# Patient Record
Sex: Female | Born: 1940 | Race: White | Hispanic: No | Marital: Married | Smoking: Never smoker
Health system: Southern US, Community
[De-identification: ages and names within clinical notes are randomized; demographics above are authoritative.]

## PROBLEM LIST (undated history)

## (undated) DIAGNOSIS — G44209 Tension-type headache, unspecified, not intractable: Secondary | ICD-10-CM

## (undated) DIAGNOSIS — Z8742 Personal history of other diseases of the female genital tract: Secondary | ICD-10-CM

## (undated) DIAGNOSIS — E785 Hyperlipidemia, unspecified: Secondary | ICD-10-CM

## (undated) DIAGNOSIS — R519 Headache, unspecified: Secondary | ICD-10-CM

## (undated) DIAGNOSIS — Z87898 Personal history of other specified conditions: Secondary | ICD-10-CM

## (undated) DIAGNOSIS — M778 Other enthesopathies, not elsewhere classified: Secondary | ICD-10-CM

## (undated) DIAGNOSIS — M810 Age-related osteoporosis without current pathological fracture: Secondary | ICD-10-CM

## (undated) DIAGNOSIS — Z8619 Personal history of other infectious and parasitic diseases: Secondary | ICD-10-CM

## (undated) DIAGNOSIS — Z8709 Personal history of other diseases of the respiratory system: Secondary | ICD-10-CM

## (undated) DIAGNOSIS — R55 Syncope and collapse: Secondary | ICD-10-CM

## (undated) DIAGNOSIS — G43909 Migraine, unspecified, not intractable, without status migrainosus: Secondary | ICD-10-CM

## (undated) DIAGNOSIS — I959 Hypotension, unspecified: Secondary | ICD-10-CM

## (undated) DIAGNOSIS — D219 Benign neoplasm of connective and other soft tissue, unspecified: Secondary | ICD-10-CM

## (undated) DIAGNOSIS — R51 Headache: Secondary | ICD-10-CM

## (undated) DIAGNOSIS — E039 Hypothyroidism, unspecified: Secondary | ICD-10-CM

## (undated) DIAGNOSIS — H532 Diplopia: Secondary | ICD-10-CM

## (undated) DIAGNOSIS — Z87440 Personal history of urinary (tract) infections: Secondary | ICD-10-CM

## (undated) DIAGNOSIS — B379 Candidiasis, unspecified: Secondary | ICD-10-CM

## (undated) DIAGNOSIS — R0609 Other forms of dyspnea: Secondary | ICD-10-CM

## (undated) HISTORY — DX: Candidiasis, unspecified: B37.9

## (undated) HISTORY — DX: Personal history of other specified conditions: Z87.898

## (undated) HISTORY — DX: Personal history of other diseases of the female genital tract: Z87.42

## (undated) HISTORY — DX: Benign neoplasm of connective and other soft tissue, unspecified: D21.9

## (undated) HISTORY — DX: Personal history of other infectious and parasitic diseases: Z86.19

## (undated) HISTORY — PX: CATARACT EXTRACTION W/ INTRAOCULAR LENS  IMPLANT, BILATERAL: SHX1307

## (undated) HISTORY — DX: Personal history of urinary (tract) infections: Z87.440

---

## 1980-06-12 HISTORY — PX: ENDOMETRIAL BIOPSY: SHX622

## 1984-06-12 HISTORY — PX: TONSILLECTOMY: SUR1361

## 1999-02-04 ENCOUNTER — Ambulatory Visit (HOSPITAL_COMMUNITY): Admission: RE | Admit: 1999-02-04 | Discharge: 1999-02-04 | Payer: Self-pay | Admitting: Family Medicine

## 1999-11-15 ENCOUNTER — Encounter: Payer: Self-pay | Admitting: Rheumatology

## 1999-11-15 ENCOUNTER — Ambulatory Visit (HOSPITAL_COMMUNITY): Admission: RE | Admit: 1999-11-15 | Discharge: 1999-11-15 | Payer: Self-pay | Admitting: *Deleted

## 1999-12-21 ENCOUNTER — Encounter: Admission: RE | Admit: 1999-12-21 | Discharge: 1999-12-21 | Payer: Self-pay | Admitting: Family Medicine

## 1999-12-21 ENCOUNTER — Encounter: Payer: Self-pay | Admitting: Family Medicine

## 1999-12-23 ENCOUNTER — Ambulatory Visit (HOSPITAL_COMMUNITY): Admission: RE | Admit: 1999-12-23 | Discharge: 1999-12-23 | Payer: Self-pay | Admitting: *Deleted

## 1999-12-23 ENCOUNTER — Encounter: Payer: Self-pay | Admitting: Rheumatology

## 2000-01-17 ENCOUNTER — Other Ambulatory Visit: Admission: RE | Admit: 2000-01-17 | Discharge: 2000-01-17 | Payer: Self-pay | Admitting: Family Medicine

## 2000-05-29 ENCOUNTER — Ambulatory Visit (HOSPITAL_COMMUNITY): Admission: RE | Admit: 2000-05-29 | Discharge: 2000-05-29 | Payer: Self-pay | Admitting: Gastroenterology

## 2000-05-29 ENCOUNTER — Encounter (INDEPENDENT_AMBULATORY_CARE_PROVIDER_SITE_OTHER): Payer: Self-pay | Admitting: *Deleted

## 2000-12-25 ENCOUNTER — Encounter: Payer: Self-pay | Admitting: Family Medicine

## 2000-12-25 ENCOUNTER — Encounter: Admission: RE | Admit: 2000-12-25 | Discharge: 2000-12-25 | Payer: Self-pay | Admitting: Family Medicine

## 2001-01-13 ENCOUNTER — Encounter: Payer: Self-pay | Admitting: Emergency Medicine

## 2001-01-13 ENCOUNTER — Emergency Department (HOSPITAL_COMMUNITY): Admission: EM | Admit: 2001-01-13 | Discharge: 2001-01-14 | Payer: Self-pay | Admitting: Emergency Medicine

## 2001-02-01 ENCOUNTER — Encounter: Payer: Self-pay | Admitting: Family Medicine

## 2001-02-01 ENCOUNTER — Encounter: Admission: RE | Admit: 2001-02-01 | Discharge: 2001-02-01 | Payer: Self-pay | Admitting: Family Medicine

## 2001-02-04 ENCOUNTER — Encounter: Payer: Self-pay | Admitting: *Deleted

## 2001-02-04 ENCOUNTER — Encounter: Admission: RE | Admit: 2001-02-04 | Discharge: 2001-02-04 | Payer: Self-pay | Admitting: *Deleted

## 2002-02-04 ENCOUNTER — Encounter: Payer: Self-pay | Admitting: Family Medicine

## 2002-02-04 ENCOUNTER — Encounter: Admission: RE | Admit: 2002-02-04 | Discharge: 2002-02-04 | Payer: Self-pay | Admitting: Family Medicine

## 2002-12-10 ENCOUNTER — Encounter: Payer: Self-pay | Admitting: Internal Medicine

## 2002-12-10 ENCOUNTER — Encounter: Admission: RE | Admit: 2002-12-10 | Discharge: 2002-12-10 | Payer: Self-pay | Admitting: Internal Medicine

## 2003-02-09 ENCOUNTER — Encounter: Admission: RE | Admit: 2003-02-09 | Discharge: 2003-02-09 | Payer: Self-pay | Admitting: Internal Medicine

## 2003-02-09 ENCOUNTER — Encounter: Payer: Self-pay | Admitting: Internal Medicine

## 2003-06-11 ENCOUNTER — Encounter: Admission: RE | Admit: 2003-06-11 | Discharge: 2003-06-11 | Payer: Self-pay | Admitting: Internal Medicine

## 2003-12-10 ENCOUNTER — Ambulatory Visit (HOSPITAL_COMMUNITY): Admission: RE | Admit: 2003-12-10 | Discharge: 2003-12-10 | Payer: Self-pay | Admitting: *Deleted

## 2004-02-11 ENCOUNTER — Encounter: Admission: RE | Admit: 2004-02-11 | Discharge: 2004-02-11 | Payer: Self-pay | Admitting: Internal Medicine

## 2004-06-20 ENCOUNTER — Encounter: Admission: RE | Admit: 2004-06-20 | Discharge: 2004-06-20 | Payer: Self-pay | Admitting: Internal Medicine

## 2005-02-28 ENCOUNTER — Encounter: Admission: RE | Admit: 2005-02-28 | Discharge: 2005-02-28 | Payer: Self-pay | Admitting: Internal Medicine

## 2005-06-21 ENCOUNTER — Other Ambulatory Visit: Admission: RE | Admit: 2005-06-21 | Discharge: 2005-06-21 | Payer: Self-pay | Admitting: Internal Medicine

## 2006-03-01 ENCOUNTER — Encounter: Admission: RE | Admit: 2006-03-01 | Discharge: 2006-03-01 | Payer: Self-pay | Admitting: Internal Medicine

## 2006-12-15 ENCOUNTER — Emergency Department (HOSPITAL_COMMUNITY): Admission: EM | Admit: 2006-12-15 | Discharge: 2006-12-15 | Payer: Self-pay | Admitting: Emergency Medicine

## 2007-03-11 ENCOUNTER — Encounter: Admission: RE | Admit: 2007-03-11 | Discharge: 2007-03-11 | Payer: Self-pay | Admitting: Internal Medicine

## 2008-03-12 ENCOUNTER — Encounter: Admission: RE | Admit: 2008-03-12 | Discharge: 2008-03-12 | Payer: Self-pay | Admitting: Internal Medicine

## 2008-03-18 ENCOUNTER — Encounter: Admission: RE | Admit: 2008-03-18 | Discharge: 2008-03-18 | Payer: Self-pay | Admitting: Internal Medicine

## 2009-03-18 ENCOUNTER — Encounter: Admission: RE | Admit: 2009-03-18 | Discharge: 2009-03-18 | Payer: Self-pay | Admitting: Internal Medicine

## 2009-03-25 ENCOUNTER — Encounter: Admission: RE | Admit: 2009-03-25 | Discharge: 2009-03-25 | Payer: Self-pay | Admitting: Internal Medicine

## 2009-07-27 ENCOUNTER — Observation Stay (HOSPITAL_COMMUNITY): Admission: EM | Admit: 2009-07-27 | Discharge: 2009-07-28 | Payer: Self-pay | Admitting: Emergency Medicine

## 2009-07-28 ENCOUNTER — Encounter (INDEPENDENT_AMBULATORY_CARE_PROVIDER_SITE_OTHER): Payer: Self-pay | Admitting: Internal Medicine

## 2009-07-28 ENCOUNTER — Ambulatory Visit: Payer: Self-pay | Admitting: Vascular Surgery

## 2010-03-22 ENCOUNTER — Encounter: Admission: RE | Admit: 2010-03-22 | Discharge: 2010-03-22 | Payer: Self-pay | Admitting: Internal Medicine

## 2010-07-03 ENCOUNTER — Encounter: Payer: Self-pay | Admitting: Internal Medicine

## 2010-09-01 LAB — POCT CARDIAC MARKERS
CKMB, poc: <1 ng/mL — ABNORMAL LOW (ref 1.0–8.0)
Myoglobin, poc: 41.6 ng/mL (ref 12–200)
Troponin i, poc: <0.05 ng/mL (ref 0.00–0.09)

## 2010-09-01 LAB — LIPID PANEL
Cholesterol: 233 mg/dL — ABNORMAL HIGH (ref 0–200)
HDL: 56 mg/dL (ref >39)
Triglycerides: 102 mg/dL (ref <150)

## 2010-09-01 LAB — DIFFERENTIAL
Basophils Absolute: 0.1 10*3/uL (ref 0.0–0.1)
Eosinophils Relative: 0 % (ref 0–5)
Lymphocytes Relative: 19 % (ref 12–46)
Lymphs Abs: 1.1 10*3/uL (ref 0.7–4.0)
Monocytes Absolute: 0.3 10*3/uL (ref 0.1–1.0)
Monocytes Relative: 6 % (ref 3–12)
Neutro Abs: 4.2 10*3/uL (ref 1.7–7.7)
Neutrophils Relative %: 74 % (ref 43–77)

## 2010-09-01 LAB — URINE CULTURE: Colony Count: NO GROWTH

## 2010-09-01 LAB — CBC
HCT: 37.2 % (ref 36.0–46.0)
Hemoglobin: 13.9 g/dL (ref 12.0–15.0)
MCHC: 33.5 g/dL (ref 30.0–36.0)
MCV: 95.2 fL (ref 78.0–100.0)
Platelets: 204 10*3/uL (ref 150–400)
WBC: 5.7 10*3/uL (ref 4.0–10.5)
WBC: 6.7 10*3/uL (ref 4.0–10.5)

## 2010-09-01 LAB — URINALYSIS, ROUTINE W REFLEX MICROSCOPIC
Ketones, ur: NEGATIVE mg/dL
Nitrite: NEGATIVE
pH: 8.5 — ABNORMAL HIGH (ref 5.0–8.0)

## 2010-09-01 LAB — CARDIAC PANEL(CRET KIN+CKTOT+MB+TROPI)
CK, MB: 0.5 ng/mL (ref 0.3–4.0)
CK, MB: 0.6 ng/mL (ref 0.3–4.0)
CK, MB: 0.7 ng/mL (ref 0.3–4.0)
Relative Index: INVALID (ref 0.0–2.5)
Total CK: 30 U/L (ref 7–177)
Total CK: 36 U/L (ref 7–177)
Troponin I: <0.01 ng/mL (ref 0.00–0.06)

## 2010-09-01 LAB — COMPREHENSIVE METABOLIC PANEL
BUN: 16 mg/dL (ref 6–23)
CO2: 22 mEq/L (ref 19–32)
Calcium: 9.1 mg/dL (ref 8.4–10.5)
Creatinine, Ser: 0.94 mg/dL (ref 0.4–1.2)
GFR calc Af Amer: >60 mL/min (ref >60)
GFR calc non Af Amer: 59 mL/min — ABNORMAL LOW (ref >60)
Glucose, Bld: 104 mg/dL — ABNORMAL HIGH (ref 70–99)
Potassium: 3.5 mEq/L (ref 3.5–5.1)
Total Protein: 6.7 g/dL (ref 6.0–8.3)

## 2010-09-01 LAB — BASIC METABOLIC PANEL
CO2: 22 mEq/L (ref 19–32)
Calcium: 8.2 mg/dL — ABNORMAL LOW (ref 8.4–10.5)
Chloride: 110 mEq/L (ref 96–112)
GFR calc Af Amer: >60 mL/min (ref >60)
Sodium: 138 mEq/L (ref 135–145)

## 2010-09-01 LAB — T4, FREE: Free T4: 1.35 ng/dL (ref 0.80–1.80)

## 2010-10-28 NOTE — Op Note (Signed)
[place]. [facility]  Patient:    Amber Lamb, Amber Lamb                     MRN: [id number] Proc. Date: 05/29/00 Adm. Date:  [id number] Attending:  Nelda Marseille CC:         Joycelyn Rua, M.D.   Operative Report  PROCEDURE:  Colonoscopy.  ENDOSCOPIST:  Petra Kuba, M.D.  INDICATIONS:  Guaiac positivity.  INFORMED CONSENT:  Consent was signed after risk, benefits, methods and options were thoroughly discussed in the office.  MEDICINES USED:  Demerol 60 mg, Versed 6 mg.  DESCRIPTION OF PROCEDURE:  Rectal inspection is pertinent for external hemorrhoids, small to medium.  Digital exam is negative.  The pediatric video colonoscope was inserted and easily advanced around the colon to the cecum. This did require some left lower quadrant pressure but no position changes. The cecum was identified by the appendiceal orifice and the ileocecal valve. The scope was inserted a very short ways in the terminal ileum which was normal.  Photodocumentation was obtained and the scope was slowly withdrawn. Cecum, ascending and transverse were normal.  In the proximal level of the splenic flexure along a fold possibly a small polyp was seen and was cold biopsied x 2.  The scope was further withdrawn.  ______ of the colon was tortuous but no other abnormalities were seen.  Once back in the rectum the scope was retroflexed revealing some internal hemorrhoids.  Scope was straightened and readvanced a short ways up the sigmoid.  Air was suctioned scope removed.  The patient tolerated the procedure well.  There was no obvious immediate complications.  ENDOSCOPIC DIAGNOSIS: 1. Internal and external hemorrhoids. 2. Questionable splenic flexure, small polyp, status post cold biopsy. 3. Otherwise within normal limits to the terminal ileum.  PLAN: 1. Await pathology to determine future colonic screening. 2. Will see her back in 6-8 weeks to recheck guaiac symptoms, try  to get a    ______ CBC from you, then Dr. Thayer Jew or Dr. Lenise Arena, in the meantime    and decide any other work-up and plans. DD:  05/29/00 TD:  05/29/00 Job: [id number]

## 2011-02-05 ENCOUNTER — Emergency Department (HOSPITAL_COMMUNITY)
Admission: EM | Admit: 2011-02-05 | Discharge: 2011-02-05 | Disposition: A | Discharge status: Home / Self Care | Payer: Medicare Other | Attending: Emergency Medicine | Admitting: Emergency Medicine

## 2011-02-05 ENCOUNTER — Emergency Department (HOSPITAL_COMMUNITY): Payer: Medicare Other

## 2011-02-05 DIAGNOSIS — G9389 Other specified disorders of brain: Secondary | ICD-10-CM | POA: Insufficient documentation

## 2011-02-05 DIAGNOSIS — R51 Headache: Secondary | ICD-10-CM | POA: Insufficient documentation

## 2011-02-05 DIAGNOSIS — F411 Generalized anxiety disorder: Secondary | ICD-10-CM | POA: Insufficient documentation

## 2011-02-05 DIAGNOSIS — M810 Age-related osteoporosis without current pathological fracture: Secondary | ICD-10-CM | POA: Insufficient documentation

## 2011-02-05 DIAGNOSIS — R42 Dizziness and giddiness: Secondary | ICD-10-CM | POA: Insufficient documentation

## 2011-02-05 LAB — CBC
Hemoglobin: 13.8 g/dL (ref 12.0–15.0)
MCH: 31.2 pg (ref 26.0–34.0)
MCHC: 34.2 g/dL (ref 30.0–36.0)
MCV: 91.2 fL (ref 78.0–100.0)
RBC: 4.43 MIL/uL (ref 3.87–5.11)

## 2011-02-05 LAB — DIFFERENTIAL
Eosinophils Absolute: 0.1 10*3/uL (ref 0.0–0.7)
Lymphs Abs: 1.6 10*3/uL (ref 0.7–4.0)
Monocytes Absolute: 0.3 10*3/uL (ref 0.1–1.0)
Monocytes Relative: 5 % (ref 3–12)
Neutro Abs: 4.2 10*3/uL (ref 1.7–7.7)
Neutrophils Relative %: 67 % (ref 43–77)

## 2011-02-05 LAB — COMPREHENSIVE METABOLIC PANEL
ALT: 13 U/L (ref 0–35)
AST: 18 U/L (ref 0–37)
Albumin: 3.8 g/dL (ref 3.5–5.2)
Alkaline Phosphatase: 63 U/L (ref 39–117)
Chloride: 110 mEq/L (ref 96–112)
Creatinine, Ser: 0.73 mg/dL (ref 0.50–1.10)
Potassium: 3.8 mEq/L (ref 3.5–5.1)
Sodium: 142 mEq/L (ref 135–145)
Total Bilirubin: 0.6 mg/dL (ref 0.3–1.2)

## 2011-02-05 LAB — PROTIME-INR
INR: 0.93 (ref 0.00–1.49)
Prothrombin Time: 12.7 seconds (ref 11.6–15.2)

## 2011-02-05 LAB — URINALYSIS, ROUTINE W REFLEX MICROSCOPIC
Bilirubin Urine: NEGATIVE
Glucose, UA: NEGATIVE mg/dL
Hgb urine dipstick: NEGATIVE
Specific Gravity, Urine: 1.008 (ref 1.005–1.030)
pH: 8 (ref 5.0–8.0)

## 2011-02-05 MED ORDER — GADOBENATE DIMEGLUMINE 529 MG/ML IV SOLN
13.0000 mL | Freq: Once | INTRAVENOUS | Status: AC | PRN
  Administered 2011-02-05: 13 mL via INTRAVENOUS

## 2011-02-14 ENCOUNTER — Other Ambulatory Visit: Payer: Self-pay | Admitting: Internal Medicine

## 2011-02-14 DIAGNOSIS — Z1231 Encounter for screening mammogram for malignant neoplasm of breast: Secondary | ICD-10-CM

## 2011-03-24 ENCOUNTER — Ambulatory Visit
Admission: RE | Admit: 2011-03-24 | Discharge: 2011-03-24 | Disposition: A | Discharge status: Home / Self Care | Payer: Medicare Other | Source: Ambulatory Visit | Attending: Internal Medicine | Admitting: Internal Medicine

## 2011-03-24 DIAGNOSIS — Z1231 Encounter for screening mammogram for malignant neoplasm of breast: Secondary | ICD-10-CM

## 2011-03-28 LAB — DIFFERENTIAL
Basophils Relative: 0
Eosinophils Absolute: 0
Monocytes Relative: 6
Neutrophils Relative %: 79 — ABNORMAL HIGH

## 2011-03-28 LAB — URINALYSIS, ROUTINE W REFLEX MICROSCOPIC
Bilirubin Urine: NEGATIVE
Hgb urine dipstick: NEGATIVE
Specific Gravity, Urine: 1.006
Urobilinogen, UA: 0.2
pH: 7

## 2011-03-28 LAB — I-STAT 8, (EC8 V) (CONVERTED LAB)
Acid-base deficit: 1
Bicarbonate: 22
Glucose, Bld: 112 — ABNORMAL HIGH
Potassium: 3.5
TCO2: 23
pH, Ven: 7.471 — ABNORMAL HIGH

## 2011-03-28 LAB — POCT I-STAT CREATININE
Creatinine, Ser: 0.8
Operator id: 291361

## 2011-03-28 LAB — OCCULT BLOOD X 1 CARD TO LAB, STOOL: Fecal Occult Bld: POSITIVE

## 2011-03-28 LAB — CBC
MCHC: 33.5
MCV: 93.5
RBC: 4.06
RDW: 13.3

## 2011-03-28 LAB — PROTIME-INR: INR: 1

## 2011-03-28 LAB — TYPE AND SCREEN

## 2011-03-28 LAB — ABO/RH: ABO/RH(D): O POS

## 2011-11-07 ENCOUNTER — Ambulatory Visit: Payer: Self-pay | Admitting: Obstetrics and Gynecology

## 2011-12-13 ENCOUNTER — Emergency Department (HOSPITAL_COMMUNITY): Payer: Medicare Other

## 2011-12-13 ENCOUNTER — Encounter (HOSPITAL_COMMUNITY): Payer: Self-pay | Admitting: Physical Medicine and Rehabilitation

## 2011-12-13 ENCOUNTER — Observation Stay (HOSPITAL_COMMUNITY)
Admission: EM | Admit: 2011-12-13 | Discharge: 2011-12-14 | Disposition: A | Discharge status: Home / Self Care | Payer: Medicare Other | Attending: Internal Medicine | Admitting: Internal Medicine

## 2011-12-13 DIAGNOSIS — R0609 Other forms of dyspnea: Secondary | ICD-10-CM | POA: Diagnosis present

## 2011-12-13 DIAGNOSIS — E785 Hyperlipidemia, unspecified: Secondary | ICD-10-CM | POA: Insufficient documentation

## 2011-12-13 DIAGNOSIS — R0600 Dyspnea, unspecified: Secondary | ICD-10-CM

## 2011-12-13 DIAGNOSIS — M778 Other enthesopathies, not elsewhere classified: Secondary | ICD-10-CM

## 2011-12-13 DIAGNOSIS — G43909 Migraine, unspecified, not intractable, without status migrainosus: Secondary | ICD-10-CM | POA: Diagnosis present

## 2011-12-13 DIAGNOSIS — R531 Weakness: Secondary | ICD-10-CM | POA: Diagnosis present

## 2011-12-13 DIAGNOSIS — R55 Syncope and collapse: Secondary | ICD-10-CM

## 2011-12-13 DIAGNOSIS — I951 Orthostatic hypotension: Principal | ICD-10-CM | POA: Insufficient documentation

## 2011-12-13 DIAGNOSIS — I959 Hypotension, unspecified: Secondary | ICD-10-CM | POA: Diagnosis present

## 2011-12-13 DIAGNOSIS — M6281 Muscle weakness (generalized): Secondary | ICD-10-CM | POA: Insufficient documentation

## 2011-12-13 DIAGNOSIS — R0989 Other specified symptoms and signs involving the circulatory and respiratory systems: Secondary | ICD-10-CM | POA: Insufficient documentation

## 2011-12-13 DIAGNOSIS — H532 Diplopia: Secondary | ICD-10-CM | POA: Diagnosis present

## 2011-12-13 DIAGNOSIS — M810 Age-related osteoporosis without current pathological fracture: Secondary | ICD-10-CM | POA: Insufficient documentation

## 2011-12-13 HISTORY — DX: Other enthesopathies, not elsewhere classified: M77.8

## 2011-12-13 HISTORY — DX: Age-related osteoporosis without current pathological fracture: M81.0

## 2011-12-13 HISTORY — DX: Diplopia: H53.2

## 2011-12-13 HISTORY — DX: Dyspnea, unspecified: R06.00

## 2011-12-13 HISTORY — DX: Hyperlipidemia, unspecified: E78.5

## 2011-12-13 HISTORY — DX: Syncope and collapse: R55

## 2011-12-13 HISTORY — DX: Migraine, unspecified, not intractable, without status migrainosus: G43.909

## 2011-12-13 HISTORY — DX: Headache: R51

## 2011-12-13 HISTORY — DX: Personal history of other diseases of the respiratory system: Z87.09

## 2011-12-13 HISTORY — DX: Hypothyroidism, unspecified: E03.9

## 2011-12-13 HISTORY — DX: Other forms of dyspnea: R06.09

## 2011-12-13 HISTORY — DX: Headache, unspecified: R51.9

## 2011-12-13 HISTORY — DX: Tension-type headache, unspecified, not intractable: G44.209

## 2011-12-13 HISTORY — DX: Hypotension, unspecified: I95.9

## 2011-12-13 LAB — CBC WITH DIFFERENTIAL/PLATELET
Basophils Relative: 0 % (ref 0–1)
HCT: 42.2 % (ref 36.0–46.0)
Hemoglobin: 14.7 g/dL (ref 12.0–15.0)
Lymphocytes Relative: 25 % (ref 12–46)
Lymphs Abs: 1.9 10*3/uL (ref 0.7–4.0)
MCHC: 34.8 g/dL (ref 30.0–36.0)
Monocytes Absolute: 0.7 10*3/uL (ref 0.1–1.0)
Monocytes Relative: 9 % (ref 3–12)
Neutro Abs: 5 10*3/uL (ref 1.7–7.7)
Neutrophils Relative %: 66 % (ref 43–77)
RBC: 4.59 MIL/uL (ref 3.87–5.11)
WBC: 7.6 10*3/uL (ref 4.0–10.5)

## 2011-12-13 LAB — URINALYSIS, ROUTINE W REFLEX MICROSCOPIC
Glucose, UA: NEGATIVE mg/dL
Hgb urine dipstick: NEGATIVE
Ketones, ur: NEGATIVE mg/dL
pH: 8 (ref 5.0–8.0)

## 2011-12-13 LAB — CBC
Hemoglobin: 13.4 g/dL (ref 12.0–15.0)
MCH: 31.2 pg (ref 26.0–34.0)
MCHC: 33.5 g/dL (ref 30.0–36.0)
Platelets: 210 10*3/uL (ref 150–400)
RDW: 13 % (ref 11.5–15.5)

## 2011-12-13 LAB — COMPREHENSIVE METABOLIC PANEL
Albumin: 3.9 g/dL (ref 3.5–5.2)
Alkaline Phosphatase: 71 U/L (ref 39–117)
BUN: 17 mg/dL (ref 6–23)
CO2: 22 mEq/L (ref 19–32)
Chloride: 102 mEq/L (ref 96–112)
Creatinine, Ser: 0.93 mg/dL (ref 0.50–1.10)
GFR calc non Af Amer: 61 mL/min — ABNORMAL LOW (ref >90)
Potassium: 3.9 mEq/L (ref 3.5–5.1)
Total Bilirubin: 0.6 mg/dL (ref 0.3–1.2)

## 2011-12-13 LAB — D-DIMER, QUANTITATIVE: D-Dimer, Quant: 0.36 ug/mL-FEU (ref 0.00–0.48)

## 2011-12-13 LAB — URINE MICROSCOPIC-ADD ON

## 2011-12-13 LAB — TROPONIN I: Troponin I: <0.3 ng/mL (ref <0.30)

## 2011-12-13 LAB — PRO B NATRIURETIC PEPTIDE: Pro B Natriuretic peptide (BNP): 78.1 pg/mL (ref 0–125)

## 2011-12-13 LAB — CARDIAC PANEL(CRET KIN+CKTOT+MB+TROPI): Total CK: 146 U/L (ref 7–177)

## 2011-12-13 LAB — CREATININE, SERUM: Creatinine, Ser: 0.77 mg/dL (ref 0.50–1.10)

## 2011-12-13 MED ORDER — ASPIRIN EC 81 MG PO TBEC
81.0000 mg | DELAYED_RELEASE_TABLET | Freq: Every day | ORAL | Status: DC
  Administered 2011-12-14: 81 mg via ORAL
  Filled 2011-12-13 (×2): qty 1

## 2011-12-13 MED ORDER — CALCIUM CARBONATE-VITAMIN D 500-200 MG-UNIT PO TABS
1.0000 | ORAL_TABLET | Freq: Every day | ORAL | Status: DC
  Administered 2011-12-14: 1 via ORAL
  Filled 2011-12-13: qty 1

## 2011-12-13 MED ORDER — ENOXAPARIN SODIUM 40 MG/0.4ML ~~LOC~~ SOLN
40.0000 mg | Freq: Every day | SUBCUTANEOUS | Status: DC
  Administered 2011-12-13: 40 mg via SUBCUTANEOUS
  Filled 2011-12-13 (×2): qty 0.4

## 2011-12-13 MED ORDER — SUMATRIPTAN SUCCINATE 100 MG PO TABS: 100.0000 mg | ORAL_TABLET | Freq: Once | ORAL | Status: DC | PRN

## 2011-12-13 MED ORDER — ACETAMINOPHEN 325 MG PO TABS: 650.0000 mg | ORAL_TABLET | Freq: Four times a day (QID) | ORAL | Status: DC | PRN

## 2011-12-13 MED ORDER — SODIUM CHLORIDE 0.9 % IV SOLN
INTRAVENOUS | Status: DC
  Administered 2011-12-14: 06:00:00 via INTRAVENOUS

## 2011-12-13 MED ORDER — TRAMADOL HCL 50 MG PO TABS
50.0000 mg | ORAL_TABLET | Freq: Two times a day (BID) | ORAL | Status: DC | PRN
  Filled 2011-12-13: qty 1

## 2011-12-13 MED ORDER — SENNA 8.6 MG PO TABS
1.0000 | ORAL_TABLET | Freq: Two times a day (BID) | ORAL | Status: DC
  Administered 2011-12-13 – 2011-12-14 (×2): 8.6 mg via ORAL
  Filled 2011-12-13 (×3): qty 1

## 2011-12-13 MED ORDER — ADULT MULTIVITAMIN W/MINERALS CH
1.0000 | ORAL_TABLET | Freq: Every day | ORAL | Status: DC
  Administered 2011-12-13 – 2011-12-14 (×2): 1 via ORAL
  Filled 2011-12-13 (×2): qty 1

## 2011-12-13 MED ORDER — SODIUM CHLORIDE 0.9 % IV BOLUS (SEPSIS)
1000.0000 mL | Freq: Once | INTRAVENOUS | Status: AC
  Administered 2011-12-13: 1000 mL via INTRAVENOUS

## 2011-12-13 MED ORDER — RIZATRIPTAN BENZOATE 10 MG PO TBDP: 10.0000 mg | ORAL_TABLET | ORAL | Status: DC | PRN

## 2011-12-13 MED ORDER — SODIUM CHLORIDE 0.9 % IJ SOLN
3.0000 mL | Freq: Two times a day (BID) | INTRAMUSCULAR | Status: DC
  Administered 2011-12-13: 3 mL via INTRAVENOUS

## 2011-12-13 MED ORDER — LORAZEPAM 2 MG/ML IJ SOLN
1.0000 mg | Freq: Once | INTRAMUSCULAR | Status: AC
  Administered 2011-12-13: 1 mg via INTRAVENOUS
  Filled 2011-12-13: qty 1

## 2011-12-13 MED ORDER — LEVOTHYROXINE SODIUM 50 MCG PO TABS
50.0000 ug | ORAL_TABLET | Freq: Every day | ORAL | Status: DC
  Administered 2011-12-14: 50 ug via ORAL
  Filled 2011-12-13 (×2): qty 1

## 2011-12-13 MED ORDER — SUMATRIPTAN SUCCINATE 50 MG PO TABS
100.0000 mg | ORAL_TABLET | Freq: Once | ORAL | Status: AC | PRN
  Filled 2011-12-13: qty 2

## 2011-12-13 MED ORDER — NITROGLYCERIN 0.4 MG SL SUBL: 0.4000 mg | SUBLINGUAL_TABLET | SUBLINGUAL | Status: DC | PRN

## 2011-12-13 MED ORDER — ONDANSETRON HCL 4 MG/2ML IJ SOLN: 4.0000 mg | Freq: Three times a day (TID) | INTRAMUSCULAR | Status: DC | PRN

## 2011-12-13 MED ORDER — ONDANSETRON HCL 4 MG/2ML IJ SOLN: 4.0000 mg | Freq: Four times a day (QID) | INTRAMUSCULAR | Status: DC | PRN

## 2011-12-13 MED ORDER — CALCIUM-VITAMIN D 500-200 MG-UNIT PO TABS: 1.0000 | ORAL_TABLET | Freq: Every day | ORAL | Status: DC

## 2011-12-13 MED ORDER — SODIUM CHLORIDE 0.9 % IV SOLN
INTRAVENOUS | Status: AC
  Administered 2011-12-13: 18:00:00 via INTRAVENOUS

## 2011-12-13 MED ORDER — LORATADINE 10 MG PO TABS
10.0000 mg | ORAL_TABLET | Freq: Every day | ORAL | Status: DC
  Administered 2011-12-13: 10 mg via ORAL
  Filled 2011-12-13 (×2): qty 1

## 2011-12-13 MED ORDER — SUMATRIPTAN SUCCINATE 100 MG PO TABS
100.0000 mg | ORAL_TABLET | Freq: Once | ORAL | Status: DC | PRN
  Filled 2011-12-13: qty 1

## 2011-12-13 MED ORDER — ONDANSETRON HCL 4 MG PO TABS: 4.0000 mg | ORAL_TABLET | Freq: Four times a day (QID) | ORAL | Status: DC | PRN

## 2011-12-13 MED ORDER — SERTRALINE HCL 100 MG PO TABS
100.0000 mg | ORAL_TABLET | Freq: Every day | ORAL | Status: DC
  Administered 2011-12-13 – 2011-12-14 (×2): 100 mg via ORAL
  Filled 2011-12-13 (×2): qty 1

## 2011-12-13 MED ORDER — ACETAMINOPHEN 650 MG RE SUPP: 650.0000 mg | Freq: Four times a day (QID) | RECTAL | Status: DC | PRN

## 2011-12-13 MED ORDER — ALUM & MAG HYDROXIDE-SIMETH 200-200-20 MG/5ML PO SUSP: 30.0000 mL | Freq: Four times a day (QID) | ORAL | Status: DC | PRN

## 2011-12-13 MED ORDER — ASPIRIN 81 MG PO CHEW
243.0000 mg | CHEWABLE_TABLET | Freq: Once | ORAL | Status: AC
  Administered 2011-12-13: 243 mg via ORAL
  Filled 2011-12-13: qty 3

## 2011-12-13 MED ORDER — OMEGA-3-ACID ETHYL ESTERS 1 G PO CAPS
1.0000 g | ORAL_CAPSULE | Freq: Every day | ORAL | Status: DC
  Administered 2011-12-13 – 2011-12-14 (×2): 1 g via ORAL
  Filled 2011-12-13 (×2): qty 1

## 2011-12-13 MED ORDER — ASPIRIN EC 81 MG PO TBEC: 81.0000 mg | DELAYED_RELEASE_TABLET | Freq: Every day | ORAL | Status: DC

## 2011-12-13 NOTE — ED Notes (Addendum)
Pt presents to department for evaluation of midsternal non radiating chest pain and SOB. Onset this morning when waking up. Pt states 8/10 "tightness" to center to chest. Also states her voice is hoarse and having L leg cramps. States these symptoms have occurred (3) times before. Respirations labored, pt tearful upon arrival. Skin warm and dry. Nothing makes pain worse. She is alert and oriented x4.

## 2011-12-13 NOTE — ED Notes (Signed)
Patient stated she was feeling a little better.  Reminded Patient and family members we need a urine sample and the call light was at bedside.  Patient smile and said she would.

## 2011-12-13 NOTE — H&P (Signed)
I have personally seen and examined Amber Lamb  I agree with PE/Ap as per M/York H and P note Amber Lamb

## 2011-12-13 NOTE — ED Notes (Signed)
MD at bedside. 

## 2011-12-13 NOTE — H&P (Signed)
[facility] History and Physical  Amber Lamb ZOX:[id number] DOB: 1940/10/17 DOA: 12/13/2011  Referring physician: Dr. Preston Lamb PCP: Amber Mountain, MD   Chief Complaint: Amber Lamb, Shortness of Breath  HPI:  71 year old female with a history of Osteoporosis, hyperlipidemia and tendonitis presents to the ED for the 3rd time over the past 2 years with similar complaints.  This morning she awoke very dizzy.  She had a cramp in her left leg.  She felt short of breath.  When she got up to go to the bathroom, she felt as though she would pass out.  She returned to bed and felt progressively weaker and weaker.  Her chest became heavy and she began to see double.  She has recurrent headaches and reports one now over her left eye.   She called her PCP's office and was referred to the ED.   The patient's family member in the room relates that last Thursday Amber Lamb had a cortisone injection for tendonitis in her left wrist.  Other than that she's had no recent illness, no new medications, she's been eating and drinking well.  No Nausea/vomiting/Diarrhea, no cough, fever, difficulty urinating  or sick contacts.  Review of Systems:  Positive for dizziness, generalized weakness, shortness of breath on exertion (for past 3 years), double vision (she sees two door knobs), and left frontal head ache.  All other systems were reviewed and found to be negative.  Past Medical History  Diagnosis Date  . Osteoporosis   . Hyperlipemia   . Hypotension    No past surgical history on file. Social History:  reports that she has never smoked. She does not have any smokeless tobacco history on file. She reports that she does not drink alcohol or use illicit drugs.  Allergies  Allergen Reactions  . Sulfa Drugs Cross Reactors Rash    Family History  Problem Relation Age of Onset  . Stroke Father     deceased  . Heart failure Mother     Prior to Admission medications   Medication Sig Start  Date End Date Taking? Authorizing Provider  aspirin EC 81 MG tablet Take 81 mg by mouth daily.   Yes Historical Provider, MD  CALCIUM-VITAMIN D PO Take 1 tablet by mouth daily.   Yes Historical Provider, MD  cetirizine (ZYRTEC) 10 MG tablet Take 10 mg by mouth at bedtime as needed. For allergies   Yes Historical Provider, MD  levothyroxine (SYNTHROID, LEVOTHROID) 50 MCG tablet Take 50 mcg by mouth daily.   Yes Historical Provider, MD  Multiple Vitamin (MULTIVITAMIN WITH MINERALS) TABS Take 1 tablet by mouth daily.   Yes Historical Provider, MD  Omega-3 Fatty Acids (FISH OIL) 1200 MG CAPS Take 2,400 mg by mouth daily.   Yes Historical Provider, MD  OVER THE COUNTER MEDICATION Place 1 spray into the nose 2 (two) times daily as needed. Nasal Crom nasal spray Congestion   Yes Historical Provider, MD  OVER THE COUNTER MEDICATION Take 2 tablets by mouth daily as needed. Headaches Tylenol Tension Headache   Yes Historical Provider, MD  OVER THE COUNTER MEDICATION Place 1 drop into both eyes daily as needed. Tel Gel Eyedrops For dryness   Yes Historical Provider, MD  rizatriptan (MAXALT-MLT) 10 MG disintegrating tablet Take 10 mg by mouth as needed. For migraines   Yes Historical Provider, MD  sertraline (ZOLOFT) 100 MG tablet Take 100 mg by mouth daily.   Yes Historical Provider, MD   Physical Exam: Amber Lamb  Vitals:   12/13/11 1119 12/13/11 1403 12/13/11 1404 12/13/11 1405  BP: 98/74 90/55 86/60  85/63  Pulse: 60 59 74 89  Temp:      TempSrc:      Resp: 18     SpO2: 100%        General:  A&O, NAD  Eyes: Pupils equal, round, and reactive to light  ENT: oropharynx pink, no exudates, nose shows no drainage  Neck: no palpable nodes, no thyromegaly, neck supple  Cardiovascular: RRR, no M/R/G, minimal lower extremity edema bilaterally  Respiratory: CTA no W/C/R, no increased work of breathing  Abdomen: soft NT, ND, +BS, no masses  Skin: no rashes, bruises, lesions  Psychiatric:    Cooperative, A&O, Well Groomed.  Neurologic: CN 2-12 grossly in tact, strength 5/5 and symmetrical, exam non-focal  Labs on Admission:  Basic Metabolic Panel:  Lab 12/13/11 1610  NA 139  K 3.9  CL 102  CO2 22  GLUCOSE 83  BUN 17  CREATININE 0.93  CALCIUM 10.4  MG --  PHOS --   Liver Function Tests:  Lab 12/13/11 0922  AST 25  ALT 17  ALKPHOS 71  BILITOT 0.6  PROT 7.4  ALBUMIN 3.9   CBC:  Lab 12/13/11 0922  WBC 7.6  NEUTROABS 5.0  HGB 14.7  HCT 42.2  MCV 91.9  PLT 218   Cardiac Enzymes:  Lab 12/13/11 1342 12/13/11 0922  CKTOTAL -- 146  CKMB -- 3.5  CKMBINDEX -- --  TROPONINI <0.30 <0.30    Radiological Exams on Admission: Dg Chest Portable 1 View  12/13/2011  *RADIOLOGY REPORT*  Clinical Data: Chest pain and shortness of breath.  PORTABLE CHEST - 1 VIEW  Comparison: 07/27/2009.  Findings: Trachea is midline.  Heart size normal.  Biapical pleural thickening.  Probable mild atelectasis and/or scarring at the lung bases, accentuated by lower lung volumes on the current study.  No pleural fluid.  IMPRESSION: Bibasilar scarring and/or atelectasis.  Original Report Authenticated By: Amber Lamb, M.D.    Assessment/Plan Principal Problem:  *Hypotension Active Problems:  Weakness generalized  Double Vision With Both Eyes Open  Dyspnea on exertion  Migraines   Presyncope. Will admit Amber Lamb under observation with Dr. Kirby Lamb as the attending.  I am uncertain if her constellation of symptoms may be atypical migraines vs orthostatic hypotension, vs anxiety.  Patient has had work up over the last 2.5 years including MRA/MRI of head and neck, as well as Carotid Dopplers, so I'm hesitant to repeat these at this time.  I do not find 2D echo results in Epic so I will order this study.  PT Evaluation ordered.  Hypotension. Orthostatic to pulse not to BP. Check a random cortisol and Tsh / free T4.  Patient on Synthroid.   Will place on gentle IVF  overnight.  Check orthostatics in the am.  Apply thigh high TED hose.  May need to consider medication to raise BP.  Migraine Headache. Symptomatic treatment with Tylenol or Ultram if needed.  I would like to avoid heavier narcotics.  Double vision. Uncertain etiology.  CN exam intact.  Possibly due to hypotension.  Will check TSH and Free T4 as she has a history of hypothyroidism.     Code Status: Full Code Family Communication: Husband and family member (sister?) at bedside Disposition Plan: likely d/c on 7/4 after 2d echo and PT evaluation  York, Tora Kindred, MD  [facility] Pager [phone]  If 7PM-7AM, please contact night-coverage  [email or web address] Password [email or web address] 12/13/2011, 4:19 PM

## 2011-12-13 NOTE — ED Provider Notes (Addendum)
History     CSN: [id number]  Arrival date & time 12/13/11  8469   First MD Initiated Contact with Patient 12/13/11 [phone]      Chief Complaint  Patient presents with  . Chest Pain  . Shortness of Breath    (Consider location/radiation/quality/duration/timing/severity/associated sxs/prior treatment) HPI 71year-old female woke up at 7:30 AM with leg cramps. She also woke up yesterday with leg cramps. She tried to apply mustard to them, then she developed sense of being weak all over and like she was going to pass out. This was associated with some heaviness in her chest. Denies nausea, vomiting, diaphoresis. She has had some numbness in her left arm and dry mouth and feels short of breath. Nothing makes her feel better and nothing makes her feel worse. These are described as severe. She says she has had evaluation in the past with no diagnosis having been found. She did take her blood pressure at home and says it was very low and thinks was 55/40. Past Medical History  Diagnosis Date  . Osteoporosis   . Hyperlipemia   . Hypotension     No past surgical history on file.  No family history on file.  History  Substance Use Topics  . Smoking status: Never Smoker   . Smokeless tobacco: Not on file  . Alcohol Use: No    OB History    Grav Para Term Preterm Abortions TAB SAB Ect Mult Living                  Review of Systems  Allergies  Sulfa drugs cross reactors  Home Medications  No current outpatient prescriptions on file.  BP 109/52  Pulse 64  Temp 99.1 F (37.3 C) (Oral)  Resp 28  SpO2 100%  Physical Exam Anxious appearing 71 year old female who is hyperventilating. Ends are significant for tachypnea with respiratory rate of 28. Oxygen saturation is 100% which is normal. Head is normocephalic and atraumatic. PERRLA, EOMI. Back is nontender and supple without adenopathy or JVD. Lungs are clear without rales, wheezes, rhonchi. Heart has regular rate and rhythm without  murmur. Abdomen is soft, flat, nontender without masses or hepatosplenomegaly. Extremities have 1+ edema, no cyanosis. Full range of motion is present. Skin is warm and dry without rash. Neurologic: Mental status is normal, cranial nerves are intact, there are no motor or sensory deficits.  ED Course  Procedures (including critical care time)  Results for orders placed during the hospital encounter of 12/13/11  CBC WITH DIFFERENTIAL      Component Value Range   WBC 7.6  4.0 - 10.5 K/uL   RBC 4.59  3.87 - 5.11 MIL/uL   Hemoglobin 14.7  12.0 - 15.0 g/dL   HCT 28.4  13.2 - 44.0 %   MCV 91.9  78.0 - 100.0 fL   MCH 32.0  26.0 - 34.0 pg   MCHC 34.8  30.0 - 36.0 g/dL   RDW 10.2  72.5 - 36.6 %   Platelets 218  150 - 400 K/uL   Neutrophils Relative 66  43 - 77 %   Neutro Abs 5.0  1.7 - 7.7 K/uL   Lymphocytes Relative 25  12 - 46 %   Lymphs Abs 1.9  0.7 - 4.0 K/uL   Monocytes Relative 9  3 - 12 %   Monocytes Absolute 0.7  0.1 - 1.0 K/uL   Eosinophils Relative 1  0 - 5 %   Eosinophils Absolute 0.1  0.0 -  0.7 K/uL   Basophils Relative 0  0 - 1 %   Basophils Absolute 0.0  0.0 - 0.1 K/uL  COMPREHENSIVE METABOLIC PANEL      Component Value Range   Sodium 139  135 - 145 mEq/L   Potassium 3.9  3.5 - 5.1 mEq/L   Chloride 102  96 - 112 mEq/L   CO2 22  19 - 32 mEq/L   Glucose, Bld 83  70 - 99 mg/dL   BUN 17  6 - 23 mg/dL   Creatinine, Ser 1.91  0.50 - 1.10 mg/dL   Calcium 47.8  8.4 - 29.5 mg/dL   Total Protein 7.4  6.0 - 8.3 g/dL   Albumin 3.9  3.5 - 5.2 g/dL   AST 25  0 - 37 U/L   ALT 17  0 - 35 U/L   Alkaline Phosphatase 71  39 - 117 U/L   Total Bilirubin 0.6  0.3 - 1.2 mg/dL   GFR calc non Af Amer 61 (*) >90 mL/min   GFR calc Af Amer 71 (*) >90 mL/min  CARDIAC PANEL(CRET KIN+CKTOT+MB+TROPI)      Component Value Range   Total CK 146  7 - 177 U/L   CK, MB 3.5  0.3 - 4.0 ng/mL   Troponin I <0.30  <0.30 ng/mL   Relative Index 2.4  0.0 - 2.5  URINALYSIS, ROUTINE W REFLEX MICROSCOPIC       Component Value Range   Color, Urine YELLOW  YELLOW   APPearance TURBID (*) CLEAR   Specific Gravity, Urine 1.014  1.005 - 1.030   pH 8.0  5.0 - 8.0   Glucose, UA NEGATIVE  NEGATIVE mg/dL   Hgb urine dipstick NEGATIVE  NEGATIVE   Bilirubin Urine NEGATIVE  NEGATIVE   Ketones, ur NEGATIVE  NEGATIVE mg/dL   Protein, ur NEGATIVE  NEGATIVE mg/dL   Urobilinogen, UA 0.2  0.0 - 1.0 mg/dL   Nitrite NEGATIVE  NEGATIVE   Leukocytes, UA TRACE (*) NEGATIVE  D-DIMER, QUANTITATIVE      Component Value Range   D-Dimer, Quant 0.36  0.00 - 0.48 ug/mL-FEU  URINE MICROSCOPIC-ADD ON      Component Value Range   WBC, UA 0-2  <3 WBC/hpf   Urine-Other AMORPHOUS URATES/PHOSPHATES    TROPONIN I      Component Value Range   Troponin I <0.30  <0.30 ng/mL   Dg Chest Portable 1 View  12/13/2011  *RADIOLOGY REPORT*  Clinical Data: Chest pain and shortness of breath.  PORTABLE CHEST - 1 VIEW  Comparison: 07/27/2009.  Findings: Trachea is midline.  Heart size normal.  Biapical pleural thickening.  Probable mild atelectasis and/or scarring at the lung bases, accentuated by lower lung volumes on the current study.  No pleural fluid.  IMPRESSION: Bibasilar scarring and/or atelectasis.  Original Report Authenticated By: Reyes Ivan, M.D.      Date: 12/13/2011  Rate: 62  Rhythm: normal sinus rhythm  QRS Axis: normal  Intervals: normal  ST/T Wave abnormalities: nonspecific ST changes  Conduction Disutrbances:none  Narrative Interpretation: Nonspecific ST changes. No old ECG available for comparison.  Old EKG Reviewed: none available    1. Orthostatic hypotension       MDM  Dizziness and chest heaviness of uncertain cause. Prior ED visits have been reviewed and she was evaluated about one year ago for vertigo and had negative MRI and MRA. She is tachypneic and anxious and I am not certain to what extent hyperventilation may be  playing a role. She will be given a therapeutic trial of nitroglycerin and  also be given a dose of Ativan. Laboratory workup has been initiated.  Cardiac markers are negative. Tachypnea resolved with Ativan. Orthostatic vital signs show a significant rise in pulse from 59 to 89. Blood pressure dropped from 90 to 85 and she was symptomatic with presyncopal sensation when she stood up. She will be admitted for hydration and further evaluation. Case is discussed with [facility] who agreed to admit the patient under observation status.      Dione Booze, MD 12/13/11 1610  Dione Booze, MD 12/13/11 [phone]

## 2011-12-14 LAB — BASIC METABOLIC PANEL
CO2: 22 mEq/L (ref 19–32)
Chloride: 111 mEq/L (ref 96–112)
Sodium: 141 mEq/L (ref 135–145)

## 2011-12-14 LAB — T4, FREE: Free T4: 1.06 ng/dL (ref 0.80–1.80)

## 2011-12-14 MED ORDER — VENLAFAXINE HCL ER 75 MG PO CP24: 75.0000 mg | ORAL_CAPSULE | Freq: Every day | ORAL | Status: AC

## 2011-12-14 NOTE — Evaluation (Signed)
Physical Therapy Evaluation Patient Details Name: Amber Lamb MRN: [id number] DOB: [birth year] Today's Date: 12/14/2011 Time: 1345-1401 PT Time Calculation (min): 16 min  PT Assessment / Plan / Recommendation Clinical Impression  Pt. admitted with presyncopal episode and hypotension and today presents asymptomatic for instability in mobility during PT eval.  She believes she is at her baseline mobility status, which includes slightly antalgic gait due to right plantar fascitis.  Pt. is aware and practices safe technique of increased time to change positions.    PT Assessment  Patent does not need any further PT services    Follow Up Recommendations  No PT follow up    Barriers to Discharge        Equipment Recommendations  None recommended by PT    Recommendations for Other Services     Frequency      Precautions / Restrictions Precautions Precautions: Other (comment) (BP trends low) Restrictions Weight Bearing Restrictions: No   Pertinent Vitals/Pain BP 92/72 and pt. Asymptomatic;  Right foot pain, chronic with no intervention needed      Mobility  Bed Mobility Bed Mobility: Supine to Sit Supine to Sit: 6: Modified independent (Device/Increase time);With rails;HOB elevated Details for Bed Mobility Assistance: safe technique demonstrated Transfers Transfers: Sit to Stand;Stand to Sit Sit to Stand: 6: Modified independent (Device/Increase time);From bed Stand to Sit: 7: Independent;To bed Details for Transfer Assistance: Pt. took a little extra time first time up to assure no dizziness.  Safe technique demonstrated. Ambulation/Gait Ambulation/Gait Assistance: 6: Modified independent (Device/Increase time) Ambulation Distance (Feet): 125 Feet Assistive device: None Ambulation/Gait Assistance Details: Pt. initially somewhat slowed in her gait, but became more comfortable .  She reports h/o right foot plantar fascitis which slows her initially.  No overt LOB.  Gait  Pattern: Step-through pattern;Antalgic (slight antalgia due to plantar fascitis right foot) Gait velocity: near normal General Gait Details: slight antalgia right foot due to history of plantar fascitis Stairs: No    Exercises     PT Diagnosis:    PT Problem List:   PT Treatment Interventions:     PT Goals    Visit Information  Last PT Received On: 12/14/11 Assistance Needed: +1    Subjective Data  Subjective: I feel much better today! Patient Stated Goal: return to independent lifestyle   Prior Functioning  Home Living Lives With: Spouse;Family;Other (Comment) (sister) Available Help at Discharge: Family Type of Home: House Home Access: Stairs to enter Secretary/administrator of Steps: 2 Entrance Stairs-Rails: Right;Left Home Layout: Two level Alternate Level Stairs-Number of Steps: 10 Alternate Level Stairs-Rails: Right Home Adaptive Equipment: None Prior Function Level of Independence: Independent Able to Take Stairs?: Yes Driving: Yes Vocation: Retired Musician: No difficulties    Cognition  Overall Cognitive Status: Appears within functional limits for tasks assessed/performed Arousal/Alertness: Awake/alert Orientation Level: Appears intact for tasks assessed Behavior During Session: [facility] for tasks performed    Extremity/Trunk Assessment Right Upper Extremity Assessment RUE ROM/Strength/Tone: WFL for tasks assessed RUE Sensation: WFL - Light Touch RUE Coordination: WFL - gross motor Left Upper Extremity Assessment LUE ROM/Strength/Tone: WFL for tasks assessed LUE Sensation: WFL - Light Touch LUE Coordination: WFL - gross motor Right Lower Extremity Assessment RLE ROM/Strength/Tone: Within functional levels RLE Sensation: WFL - Light Touch RLE Coordination: WFL - gross motor Left Lower Extremity Assessment LLE ROM/Strength/Tone: Within functional levels LLE Sensation: WFL - Light Touch LLE Coordination: WFL - gross motor Trunk  Assessment Trunk Assessment: Normal   Balance Balance Balance  Assessed: Yes Static Standing Balance Static Standing - Balance Support: No upper extremity supported Static Standing - Level of Assistance: 7: Independent Single Leg Stance - Right Leg: 10  Single Leg Stance - Left Leg: 10  Dynamic Standing Balance Dynamic Standing - Balance Support: No upper extremity supported Dynamic Standing - Level of Assistance: 7: Independent Dynamic Standing - Balance Activities: Reaching for objects Dynamic Standing - Comments: able to reach to floor to grasp ink pen without LOB  End of Session PT - End of Session Equipment Utilized During Treatment: Gait belt Activity Tolerance: Patient tolerated treatment well Patient left: in bed;with call bell/phone within reach;with nursing in room;with family/visitor present Nurse Communication: Mobility status  GP Functional Assessment Tool Used: clinical judgement Functional Limitation: Mobility: Walking and moving around Mobility: Walking and Moving Around Current Status [phone]): 0 percent impaired, limited or restricted Mobility: Walking and Moving Around Goal Status [phone]): 0 percent impaired, limited or restricted Mobility: Walking and Moving Around Discharge Status [phone]): 0 percent impaired, limited or restricted   Ferman Hamming 12/14/2011, 2:27 PM Acute Rehabilitation Services [phone] (pager)

## 2011-12-14 NOTE — Discharge Summary (Signed)
Physician Discharge Summary  NAME:Amber Lamb  ZOX:[id number]  DOB: 08-Feb-1941   Admit date: 12/13/2011 Discharge date: 12/14/2011  Discharge Diagnoses:  Principal Problem:  Hypotension - orthostatic hypotension likely secondary to SSRI therapy Active Problems:  Weakness generalized - related to hypotension  Double Vision With Both Eyes Open - related to hypotension  Dyspnea on exertion - related to hypotension  Migraines - aware  Discharge Condition: Improved with hydration  Hospital Course:   Amber Lamb is a very pleasant 71 year old female with a history of osteoporosis, hyperlipidemia, and tendinitis.  She has had 3 ER presentations over the past 2 years with similar complaints of feeling swimming headed, lightheaded and shortness of breath.  This tends to occur when she rises from a lying or sitting position, especially occurs early in the morning.  On the day of admission she awakened to go to the bathroom and sit up without assistance she was going to pass out.  She returned to her bed and felt progressively weaker.  She had some chest heaviness and had double vision.  Over a short period of time, the symptoms resolved.  While hospitalized she was hydrated and she does feel better.  Unfortunately, she was given her sertraline this morning which I think is the culprit for her drop in blood pressure with standing, especially after lying down overnight sleep with likely more relaxation of her arteries while lying down.  We'll be stopping sertraline and switch over to an SSNI for chronic anxiety and stress.  She has a lot of stress taking care of her husband who has severe Parkinson's disease, progressive dementia, and peripheral neuropathy  Consults: Noncontributory  Physical exam:  Late middle-aged female in no acute distress, alert oriented and conversive  Filed Vitals:   12/14/11 0516 12/14/11 0517 12/14/11 0518 12/14/11 0932  BP: 103/63 101/65 105/65 99/66  Pulse: 59 65  66 69  Temp: 98.1 F (36.7 C)     TempSrc: Oral     Resp: 16     Height:      Weight:      SpO2: 96%       General: A&O, NAD  Eyes: Pupils equal, round, and reactive to light  ENT: oropharynx pink, no exudates, nose shows no drainage  Neck: no palpable nodes, no thyromegaly, neck supple  Cardiovascular: RRR, no M/R/G, minimal lower extremity edema bilaterally  Respiratory: CTA no W/C/R, no increased work of breathing  Abdomen: soft NT, ND, +BS, no masses  Skin: no rashes, bruises, lesions  Psychiatric: Cooperative, A&O, Well Groomed.  Neurologic: CN 2-12 grossly in tact, strength 5/5 and symmetrical, exam non-focal  Disposition: Discharged home with change of medications from SSRI therapy to SSNI therapy to help prevent symptomatic orthostatic hypotension.  Patient will try to stay well hydrated as well and we will followup in the office next week  Discharge Orders    Future Appointments: Provider: Department: Dept Phone: Center:   01/08/2012 2:45 PM Hal Morales, MD [facility] [phone] None     Medication List  As of 12/14/2011 10:35 AM   STOP taking these medications         sertraline 100 MG tablet         TAKE these medications         aspirin EC 81 MG tablet   Take 81 mg by mouth daily.      CALCIUM-VITAMIN D PO   Take 1 tablet by mouth daily.      cetirizine  10 MG tablet   Commonly known as: ZYRTEC   Take 10 mg by mouth at bedtime as needed. For allergies      Fish Oil 1200 MG Caps   Take 2,400 mg by mouth daily.      levothyroxine 50 MCG tablet   Commonly known as: SYNTHROID, LEVOTHROID   Take 50 mcg by mouth daily.      multivitamin with minerals Tabs   Take 1 tablet by mouth daily.      OVER THE COUNTER MEDICATION   Place 1 spray into the nose 2 (two) times daily as needed. Nasal Crom nasal spray  Congestion      OVER THE COUNTER MEDICATION   Take 2 tablets by mouth daily as needed. Headaches  Tylenol Tension Headache      OVER THE  COUNTER MEDICATION   Place 1 drop into both eyes daily as needed. Tel Gel Eyedrops  For dryness      rizatriptan 10 MG disintegrating tablet   Commonly known as: MAXALT-MLT   Take 10 mg by mouth as needed. For migraines      venlafaxine XR 75 MG 24 hr capsule   Commonly known as: EFFEXOR-XR   Take 1 capsule (75 mg total) by mouth daily.             Things to follow up in the outpatient setting: Continued lightheadedness and low blood pressure  Time coordinating discharge: 25 minutes  The results of significant diagnostics from this hospitalization (including imaging, microbiology, ancillary and laboratory) are listed below for reference.    Significant Diagnostic Studies: Dg Chest Portable 1 View  12/13/2011  *RADIOLOGY REPORT*  Clinical Data: Chest pain and shortness of breath.  PORTABLE CHEST - 1 VIEW  Comparison: 07/27/2009.  Findings: Trachea is midline.  Heart size normal.  Biapical pleural thickening.  Probable mild atelectasis and/or scarring at the lung bases, accentuated by lower lung volumes on the current study.  No pleural fluid.  IMPRESSION: Bibasilar scarring and/or atelectasis.  Original Report Authenticated By: Reyes Ivan, M.D.    Microbiology: No results found for this or any previous visit (from the past 240 hour(s)).   Labs: Results for orders placed during the hospital encounter of 12/13/11  CBC WITH DIFFERENTIAL      Component Value Range   WBC 7.6  4.0 - 10.5 K/uL   RBC 4.59  3.87 - 5.11 MIL/uL   Hemoglobin 14.7  12.0 - 15.0 g/dL   HCT 16.1  09.6 - 04.5 %   MCV 91.9  78.0 - 100.0 fL   MCH 32.0  26.0 - 34.0 pg   MCHC 34.8  30.0 - 36.0 g/dL   RDW 40.9  81.1 - 91.4 %   Platelets 218  150 - 400 K/uL   Neutrophils Relative 66  43 - 77 %   Neutro Abs 5.0  1.7 - 7.7 K/uL   Lymphocytes Relative 25  12 - 46 %   Lymphs Abs 1.9  0.7 - 4.0 K/uL   Monocytes Relative 9  3 - 12 %   Monocytes Absolute 0.7  0.1 - 1.0 K/uL   Eosinophils Relative 1  0 - 5  %   Eosinophils Absolute 0.1  0.0 - 0.7 K/uL   Basophils Relative 0  0 - 1 %   Basophils Absolute 0.0  0.0 - 0.1 K/uL  COMPREHENSIVE METABOLIC PANEL      Component Value Range   Sodium 139  135 - 145 mEq/L  Potassium 3.9  3.5 - 5.1 mEq/L   Chloride 102  96 - 112 mEq/L   CO2 22  19 - 32 mEq/L   Glucose, Bld 83  70 - 99 mg/dL   BUN 17  6 - 23 mg/dL   Creatinine, Ser 1.61  0.50 - 1.10 mg/dL   Calcium 09.6  8.4 - 04.5 mg/dL   Total Protein 7.4  6.0 - 8.3 g/dL   Albumin 3.9  3.5 - 5.2 g/dL   AST 25  0 - 37 U/L   ALT 17  0 - 35 U/L   Alkaline Phosphatase 71  39 - 117 U/L   Total Bilirubin 0.6  0.3 - 1.2 mg/dL   GFR calc non Af Amer 61 (*) >90 mL/min   GFR calc Af Amer 71 (*) >90 mL/min  CARDIAC PANEL(CRET KIN+CKTOT+MB+TROPI)      Component Value Range   Total CK 146  7 - 177 U/L   CK, MB 3.5  0.3 - 4.0 ng/mL   Troponin I <0.30  <0.30 ng/mL   Relative Index 2.4  0.0 - 2.5  URINALYSIS, ROUTINE W REFLEX MICROSCOPIC      Component Value Range   Color, Urine YELLOW  YELLOW   APPearance TURBID (*) CLEAR   Specific Gravity, Urine 1.014  1.005 - 1.030   pH 8.0  5.0 - 8.0   Glucose, UA NEGATIVE  NEGATIVE mg/dL   Hgb urine dipstick NEGATIVE  NEGATIVE   Bilirubin Urine NEGATIVE  NEGATIVE   Ketones, ur NEGATIVE  NEGATIVE mg/dL   Protein, ur NEGATIVE  NEGATIVE mg/dL   Urobilinogen, UA 0.2  0.0 - 1.0 mg/dL   Nitrite NEGATIVE  NEGATIVE   Leukocytes, UA TRACE (*) NEGATIVE  D-DIMER, QUANTITATIVE      Component Value Range   D-Dimer, Quant 0.36  0.00 - 0.48 ug/mL-FEU  URINE MICROSCOPIC-ADD ON      Component Value Range   WBC, UA 0-2  <3 WBC/hpf   Urine-Other AMORPHOUS URATES/PHOSPHATES    TROPONIN I      Component Value Range   Troponin I <0.30  <0.30 ng/mL  CORTISOL      Component Value Range   Cortisol, Plasma 18.9    CBC      Component Value Range   WBC 7.1  4.0 - 10.5 K/uL   RBC 4.30  3.87 - 5.11 MIL/uL   Hemoglobin 13.4  12.0 - 15.0 g/dL   HCT 40.9  81.1 - 91.4 %   MCV 93.0   78.0 - 100.0 fL   MCH 31.2  26.0 - 34.0 pg   MCHC 33.5  30.0 - 36.0 g/dL   RDW 78.2  95.6 - 21.3 %   Platelets 210  150 - 400 K/uL  CREATININE, SERUM      Component Value Range   Creatinine, Ser 0.77  0.50 - 1.10 mg/dL   GFR calc non Af Amer 83 (*) >90 mL/min   GFR calc Af Amer >90  >90 mL/min  TSH      Component Value Range   TSH 1.994  0.350 - 4.500 uIU/mL  PRO B NATRIURETIC PEPTIDE      Component Value Range   Pro B Natriuretic peptide (BNP) 78.1  0 - 125 pg/mL  BASIC METABOLIC PANEL      Component Value Range   Sodium 141  135 - 145 mEq/L   Potassium 3.9  3.5 - 5.1 mEq/L   Chloride 111  96 - 112 mEq/L  CO2 22  19 - 32 mEq/L   Glucose, Bld 92  70 - 99 mg/dL   BUN 13  6 - 23 mg/dL   Creatinine, Ser 7.84  0.50 - 1.10 mg/dL   Calcium 8.4  8.4 - 69.6 mg/dL   GFR calc non Af Amer 85 (*) >90 mL/min   GFR calc Af Amer >90  >90 mL/min     Signed: Mahamed Zalewski NEVILL 12/14/2011, 10:35 AM

## 2011-12-14 NOTE — Progress Notes (Signed)
Amber Lamb to be D/C'd Home per MD order.  Discussed with the patient and all questions fully answered.   Amber Lamb, Amber Lamb  Home Medication Instructions RUE:[id number]   Printed on:12/14/11 1316  Medication Information                    cetirizine (ZYRTEC) 10 MG tablet Take 10 mg by mouth at bedtime as needed. For allergies           levothyroxine (SYNTHROID, LEVOTHROID) 50 MCG tablet Take 50 mcg by mouth daily.           rizatriptan (MAXALT-MLT) 10 MG disintegrating tablet Take 10 mg by mouth as needed. For migraines           aspirin EC 81 MG tablet Take 81 mg by mouth daily.           Omega-3 Fatty Acids (FISH OIL) 1200 MG CAPS Take 2,400 mg by mouth daily.           Multiple Vitamin (MULTIVITAMIN WITH MINERALS) TABS Take 1 tablet by mouth daily.           CALCIUM-VITAMIN D PO Take 1 tablet by mouth daily.           OVER THE COUNTER MEDICATION Place 1 spray into the nose 2 (two) times daily as needed. Nasal Crom nasal spray Congestion           OVER THE COUNTER MEDICATION Take 2 tablets by mouth daily as needed. Headaches Tylenol Tension Headache           OVER THE COUNTER MEDICATION Place 1 drop into both eyes daily as needed. Tel Gel Eyedrops For dryness           venlafaxine XR (EFFEXOR XR) 75 MG 24 hr capsule Take 1 capsule (75 mg total) by mouth daily.             VVS, Skin clean, dry and intact without evidence of skin break down, no evidence of skin tears noted. IV catheter discontinued intact. Site without signs and symptoms of complications. Dressing and pressure applied.  An After Visit Summary was printed and given to the patient. Patient escorted via WC, and D/C home via private auto.  [name], [name] D 12/14/2011 1:16 PM

## 2011-12-14 NOTE — Progress Notes (Signed)
*  PRELIMINARY RESULTS* Echocardiogram 2D Echocardiogram has been performed.  Amber Lamb 12/14/2011, 12:53 PM

## 2012-01-08 ENCOUNTER — Encounter: Payer: Self-pay | Admitting: Obstetrics and Gynecology

## 2012-01-08 ENCOUNTER — Ambulatory Visit (INDEPENDENT_AMBULATORY_CARE_PROVIDER_SITE_OTHER): Payer: Medicare Other | Admitting: Obstetrics and Gynecology

## 2012-01-08 VITALS — BP 102/62 | Ht 58.5 in | Wt 134.0 lb

## 2012-01-08 DIAGNOSIS — Z124 Encounter for screening for malignant neoplasm of cervix: Secondary | ICD-10-CM

## 2012-01-08 DIAGNOSIS — M810 Age-related osteoporosis without current pathological fracture: Secondary | ICD-10-CM

## 2012-01-08 NOTE — Progress Notes (Signed)
Last Pap: 10/27/2009 WNL: Yes Regular Periods:no Contraception: Post-Menopausal  Monthly Breast exam:no Tetanus<5yrs:yes Nl.Bladder Function:yes Daily BMs:yes Healthy Diet:yes Calcium:yes Mammogram:yes Date of Mammogram: 03/2011 Exercise:yes Have often Exercise: 4 times weekly Seatbelt: yes Abuse at home: no Stressful work:no Sigmoid-colonoscopy: 6 years ago Bone Density: Yes 2 years in Aug.  Due 01/2012 PCP: Danae Chen Change in PMH: none Change in [facility]: none Subjective:    Amber Lamb is a 71 y.o. female G2P1002 who presents for annual exam.  The patient has no complaints today.   The following portions of the patient's history were reviewed and updated as appropriate: allergies, current medications, past family history, past medical history, past social history, past surgical history and problem list.  Review of Systems Pertinent items are noted in HPI. Gastrointestinal:No change in bowel habits, no abdominal pain, no rectal bleeding Genitourinary:negative for dysuria, frequency, hematuria, nocturia and urinary incontinence    Objective:     BP 102/62  Ht 4' 10.5" (1.486 m)  Wt 134 lb (60.782 kg)  BMI 27.53 kg/m2  Weight:  Wt Readings from Last 1 Encounters:  01/08/12 134 lb (60.782 kg)     BMI: Body mass index is 27.53 kg/(m^2). General Appearance: Alert, appropriate appearance for age. No acute distress HEENT: Grossly normal Neck / Thyroid: Supple, no masses, nodes or enlargement Lungs: clear to auscultation bilaterally Back: No CVA tenderness Breast Exam: No masses or nodes.No dimpling, nipple retraction or discharge. Cardiovascular: Regular rate and rhythm. S1, S2, no murmur Gastrointestinal: Soft, non-tender, no masses or organomegaly Pelvic Exam: External genitalia: normal general appearance Vaginal: atrophic mucosa Cervix: atrophic Uterus: normal single, nontender Adnexae:  No masses Rectovaginal: normal rectal, no masses Lymphatic Exam:  Non-palpable nodes in neck, clavicular, axillary, or inguinal regions Skin: no rash or abnormalities Neurologic: Normal gait and speech, no tremor  Psychiatric: Alert and oriented, appropriate affect.    Urinalysis:Not done      Assessment:    Normal gyn exam Menopause    Plan:    Discussed healthy lifestyle modifications.   Follow-up:  for annual exam

## 2012-01-09 LAB — PAP IG AND HPV HIGH-RISK: HPV DNA High Risk: NOT DETECTED

## 2012-02-14 ENCOUNTER — Other Ambulatory Visit: Payer: Self-pay | Admitting: Internal Medicine

## 2012-02-14 DIAGNOSIS — Z1231 Encounter for screening mammogram for malignant neoplasm of breast: Secondary | ICD-10-CM

## 2012-03-26 ENCOUNTER — Ambulatory Visit
Admission: RE | Admit: 2012-03-26 | Discharge: 2012-03-26 | Disposition: A | Discharge status: Home / Self Care | Payer: Medicare Other | Source: Ambulatory Visit | Attending: Internal Medicine | Admitting: Internal Medicine

## 2012-03-26 DIAGNOSIS — Z1231 Encounter for screening mammogram for malignant neoplasm of breast: Secondary | ICD-10-CM

## 2013-03-24 ENCOUNTER — Other Ambulatory Visit: Payer: Self-pay

## 2013-03-24 DIAGNOSIS — Z1231 Encounter for screening mammogram for malignant neoplasm of breast: Secondary | ICD-10-CM

## 2013-04-08 ENCOUNTER — Ambulatory Visit
Admission: RE | Admit: 2013-04-08 | Discharge: 2013-04-08 | Disposition: A | Discharge status: Home / Self Care | Payer: Self-pay | Source: Ambulatory Visit

## 2013-04-08 DIAGNOSIS — Z1231 Encounter for screening mammogram for malignant neoplasm of breast: Secondary | ICD-10-CM

## 2013-08-21 ENCOUNTER — Ambulatory Visit
Admission: RE | Admit: 2013-08-21 | Discharge: 2013-08-21 | Disposition: A | Discharge status: Home / Self Care | Payer: Medicare Other | Source: Ambulatory Visit | Attending: Internal Medicine | Admitting: Internal Medicine

## 2013-08-21 ENCOUNTER — Other Ambulatory Visit: Payer: Self-pay | Admitting: Internal Medicine

## 2013-08-21 DIAGNOSIS — M2550 Pain in unspecified joint: Secondary | ICD-10-CM

## 2014-02-04 ENCOUNTER — Other Ambulatory Visit: Payer: Self-pay | Admitting: Obstetrics and Gynecology

## 2014-02-04 DIAGNOSIS — N644 Mastodynia: Secondary | ICD-10-CM

## 2014-02-10 ENCOUNTER — Ambulatory Visit
Admission: RE | Admit: 2014-02-10 | Discharge: 2014-02-10 | Disposition: A | Discharge status: Home / Self Care | Payer: Medicare Other | Source: Ambulatory Visit | Attending: Obstetrics and Gynecology | Admitting: Obstetrics and Gynecology

## 2014-02-10 DIAGNOSIS — N644 Mastodynia: Secondary | ICD-10-CM

## 2014-03-05 ENCOUNTER — Other Ambulatory Visit: Payer: Self-pay

## 2014-03-05 DIAGNOSIS — Z1231 Encounter for screening mammogram for malignant neoplasm of breast: Secondary | ICD-10-CM

## 2014-04-10 ENCOUNTER — Ambulatory Visit
Admission: RE | Admit: 2014-04-10 | Discharge: 2014-04-10 | Disposition: A | Discharge status: Home / Self Care | Payer: Medicare Other | Source: Ambulatory Visit

## 2014-04-10 DIAGNOSIS — Z1231 Encounter for screening mammogram for malignant neoplasm of breast: Secondary | ICD-10-CM

## 2014-06-19 ENCOUNTER — Ambulatory Visit
Admission: RE | Admit: 2014-06-19 | Discharge: 2014-06-19 | Disposition: A | Discharge status: Home / Self Care | Payer: Medicare Other | Source: Ambulatory Visit | Attending: Nurse Practitioner | Admitting: Nurse Practitioner

## 2014-06-19 ENCOUNTER — Other Ambulatory Visit: Payer: Self-pay | Admitting: Nurse Practitioner

## 2014-06-19 DIAGNOSIS — J069 Acute upper respiratory infection, unspecified: Secondary | ICD-10-CM

## 2014-08-04 ENCOUNTER — Other Ambulatory Visit: Payer: Self-pay | Admitting: Dermatology

## 2015-09-13 IMAGING — CR DG CHEST 2V
2 series · 2 of 2 positions shown · non-contrast
Comparison: 12/13/2011

CLINICAL DATA: Cough, congestion for 3 weeks

EXAM:
CHEST  2 VIEW

[w chest pa]
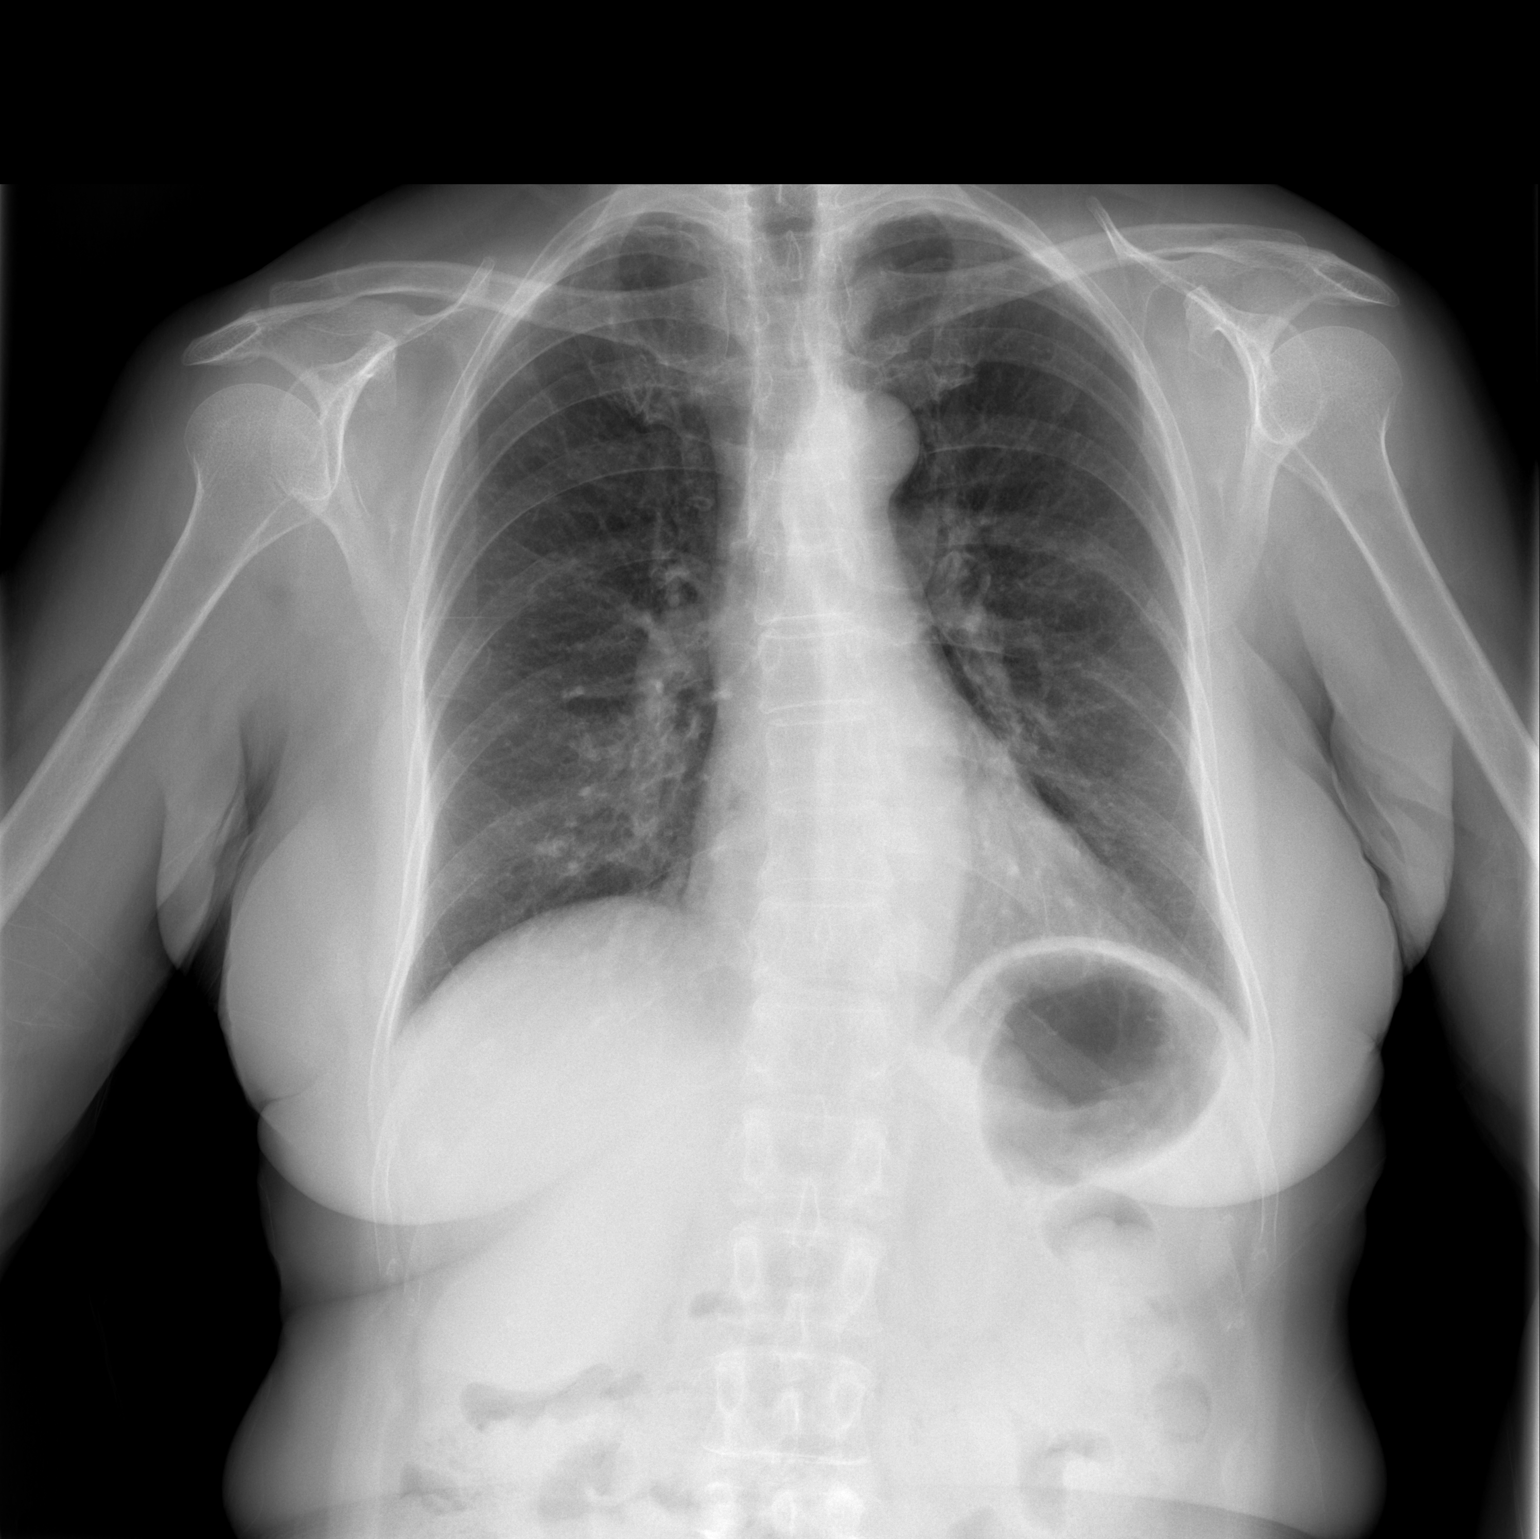

[w chest lat]
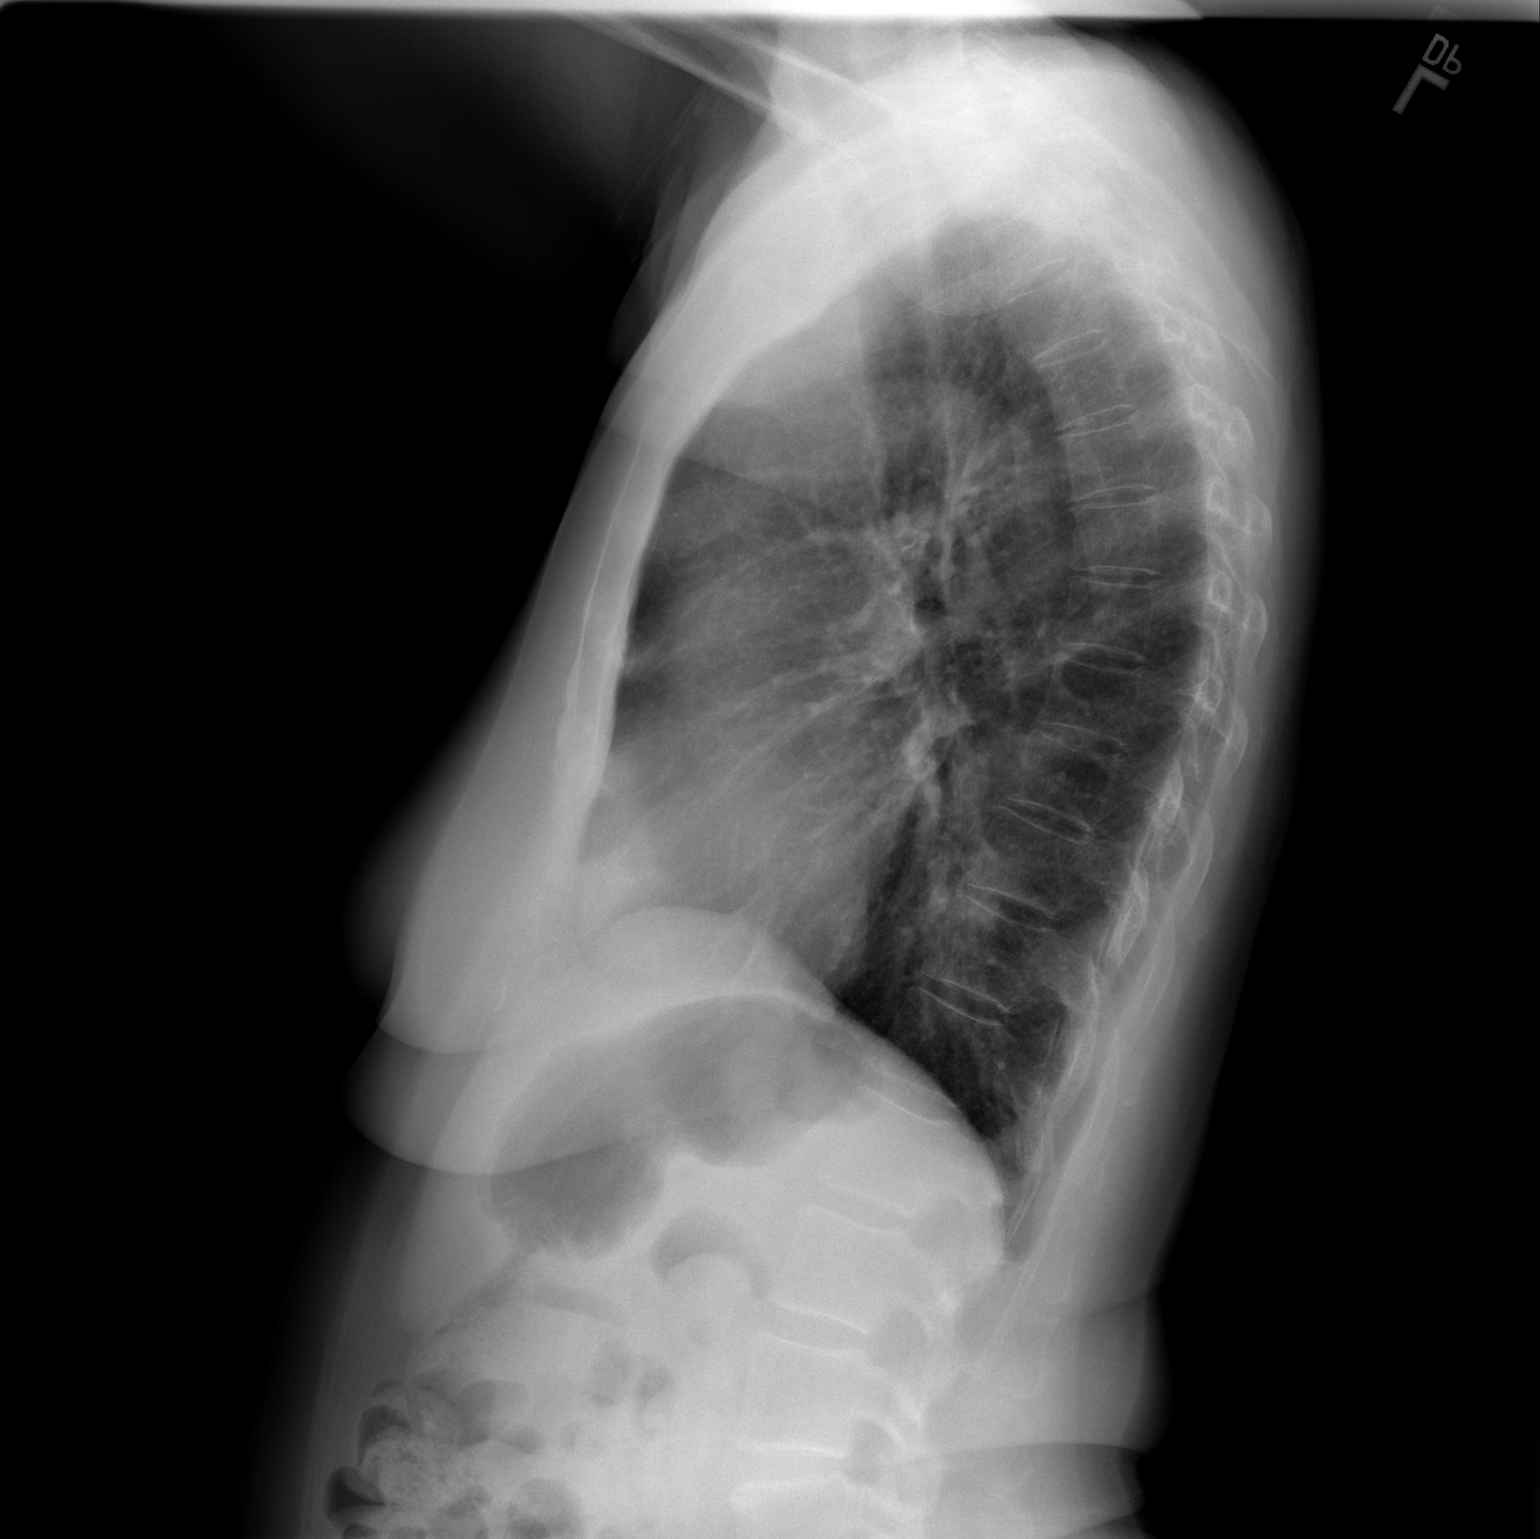

[2 of 2 positions shown; findings below may reference images not displayed]

FINDINGS: There is no focal parenchymal opacity, pleural effusion, or
pneumothorax. The heart and mediastinal contours are unremarkable.

Levo curvature of the thoracolumbar spine which may be positional.
No acute osseous abnormality.
IMPRESSION: No active cardiopulmonary disease.
